# Patient Record
Sex: Male | Born: 1975 | Race: Black or African American | Hispanic: No | Marital: Married
Health system: Southern US, Community
[De-identification: ages and names within clinical notes are randomized; demographics above are authoritative.]

## PROBLEM LIST (undated history)

## (undated) DIAGNOSIS — Z87442 Personal history of urinary calculi: Secondary | ICD-10-CM

## (undated) HISTORY — PX: HIP SURGERY: SHX245

---

## 2011-07-26 ENCOUNTER — Emergency Department (HOSPITAL_COMMUNITY)
Admission: EM | Admit: 2011-07-26 | Discharge: 2011-07-26 | Disposition: A | Discharge status: Home / Self Care | Payer: BC Managed Care – PPO | Attending: Emergency Medicine | Admitting: Emergency Medicine

## 2011-07-26 ENCOUNTER — Encounter (HOSPITAL_COMMUNITY): Payer: Self-pay | Admitting: *Deleted

## 2011-07-26 DIAGNOSIS — K047 Periapical abscess without sinus: Secondary | ICD-10-CM | POA: Insufficient documentation

## 2011-07-26 MED ORDER — AMOXICILLIN 500 MG PO CAPS: 500.0000 mg | ORAL_CAPSULE | Freq: Three times a day (TID) | ORAL | Status: AC

## 2011-07-26 MED ORDER — NAPROXEN 500 MG PO TABS: 500.0000 mg | ORAL_TABLET | Freq: Two times a day (BID) | ORAL | Status: AC

## 2011-07-26 MED ORDER — AMOXICILLIN 500 MG PO CAPS
500.0000 mg | ORAL_CAPSULE | Freq: Once | ORAL | Status: AC
  Administered 2011-07-26: 500 mg via ORAL
  Filled 2011-07-26: qty 1

## 2011-07-26 MED ORDER — HYDROCODONE-ACETAMINOPHEN 5-500 MG PO TABS: 1.0000 | ORAL_TABLET | Freq: Four times a day (QID) | ORAL | Status: AC | PRN

## 2011-07-26 MED ORDER — KETOROLAC TROMETHAMINE 60 MG/2ML IM SOLN
60.0000 mg | Freq: Once | INTRAMUSCULAR | Status: AC
  Administered 2011-07-26: 60 mg via INTRAMUSCULAR
  Filled 2011-07-26: qty 2

## 2011-07-26 NOTE — ED Provider Notes (Signed)
History     CSN: [id number]  Arrival date & time 07/26/11  0100   First MD Initiated Contact with Patient 07/26/11 [phone]      Chief Complaint  Patient presents with  . Facial Swelling  . Abscess    inside left gum    (Consider location/radiation/quality/duration/timing/severity/associated sxs/prior treatment) HPI Comments: Left upper gum pain in the mouth, this started approximately 2 days ago, has been persistent, gradually worsening and associated with pain in the cheek. There is been no fevers nausea vomiting pain under the or difficulty swallowing. Symptoms are persistent, worse with palpation. The patient does not have a dentist  Patient is a 36 y.o. male presenting with abscess. The history is provided by the patient and the spouse.  Abscess  Pertinent negatives include no fever, no vomiting and no sore throat.    History reviewed. No pertinent past medical history.  Past Surgical History  Procedure Date  . Hip surgery     History reviewed. No pertinent family history.  History  Substance Use Topics  . Smoking status: Not on file  . Smokeless tobacco: Not on file  . Alcohol Use: No      Review of Systems  Constitutional: Negative for fever and chills.  HENT: Positive for facial swelling and dental problem. Negative for sore throat, trouble swallowing and voice change.        Toothache  Gastrointestinal: Negative for nausea and vomiting.    Allergies  Review of patient's allergies indicates no known allergies.  Home Medications   Current Outpatient Rx  Name Route Sig Dispense Refill  . AMOXICILLIN 500 MG PO CAPS Oral Take 1 capsule (500 mg total) by mouth 3 (three) times daily. 30 capsule 0  . HYDROCODONE-ACETAMINOPHEN 5-500 MG PO TABS Oral Take 1-2 tablets by mouth every 6 (six) hours as needed for pain. 15 tablet 0  . NAPROXEN 500 MG PO TABS Oral Take 1 tablet (500 mg total) by mouth 2 (two) times daily with a meal. 30 tablet 0    BP 137/77  Pulse 81   Temp 99.8 F (37.7 C) (Oral)  Resp 20  Ht 6\' 1"  (1.854 m)  Wt 226 lb (102.513 kg)  BMI 29.82 kg/m2  SpO2 100%  Physical Exam  Nursing note and vitals reviewed. Constitutional: He appears well-developed and well-nourished. No distress.  HENT:  Head: Normocephalic and atraumatic.  Mouth/Throat: Oropharynx is clear and moist. No oropharyngeal exudate.       Dental Disease is mild to moderate, tenderness over the left upper gum above the canine and premolar, no fluctuant mass  Eyes: Conjunctivae are normal. No scleral icterus.  Neck: Normal range of motion. Neck supple. No thyromegaly present.  Cardiovascular: Normal rate and regular rhythm.   Pulmonary/Chest: Effort normal and breath sounds normal.  Lymphadenopathy:    He has no cervical adenopathy.  Neurological: He is alert.  Skin: Skin is warm and dry. No rash noted. He is not diaphoretic.    ED Course  Procedures (including critical care time)  Labs Reviewed - No data to display No results found.   1. Dental abscess       MDM  Early dental abscess, no fever, no tachycardia, no tenderness under the tongue on palpation, no signs of Ludwig angina. Patient amenable to antibiotics and followup in the outpatient setting, dental followup information given.  Discharge Prescriptions include:  Naprosyn Hydrocodone Amox         Vida Roller, MD 07/26/11 [phone]

## 2011-07-26 NOTE — ED Notes (Signed)
Pt states he had an abscess on inside of left gum and that today it is swelling and causing pain in his left eye and closing in left nasal passage.

## 2013-09-04 ENCOUNTER — Emergency Department (HOSPITAL_BASED_OUTPATIENT_CLINIC_OR_DEPARTMENT_OTHER)
Admission: EM | Admit: 2013-09-04 | Discharge: 2013-09-05 | Disposition: A | Discharge status: Home / Self Care | Payer: BC Managed Care – PPO | Attending: Emergency Medicine | Admitting: Emergency Medicine

## 2013-09-04 ENCOUNTER — Encounter (HOSPITAL_BASED_OUTPATIENT_CLINIC_OR_DEPARTMENT_OTHER): Payer: Self-pay | Admitting: Emergency Medicine

## 2013-09-04 DIAGNOSIS — R109 Unspecified abdominal pain: Secondary | ICD-10-CM | POA: Diagnosis not present

## 2013-09-04 DIAGNOSIS — R51 Headache: Secondary | ICD-10-CM | POA: Insufficient documentation

## 2013-09-04 DIAGNOSIS — IMO0001 Reserved for inherently not codable concepts without codable children: Secondary | ICD-10-CM | POA: Insufficient documentation

## 2013-09-04 DIAGNOSIS — R197 Diarrhea, unspecified: Secondary | ICD-10-CM | POA: Diagnosis not present

## 2013-09-04 DIAGNOSIS — R112 Nausea with vomiting, unspecified: Secondary | ICD-10-CM | POA: Diagnosis not present

## 2013-09-04 DIAGNOSIS — R1110 Vomiting, unspecified: Secondary | ICD-10-CM

## 2013-09-04 LAB — URINE MICROSCOPIC-ADD ON

## 2013-09-04 LAB — URINALYSIS, ROUTINE W REFLEX MICROSCOPIC
Bilirubin Urine: NEGATIVE
Glucose, UA: NEGATIVE mg/dL
Ketones, ur: NEGATIVE mg/dL
LEUKOCYTES UA: NEGATIVE
NITRITE: NEGATIVE
PH: 6.5 (ref 5.0–8.0)
Protein, ur: NEGATIVE mg/dL
SPECIFIC GRAVITY, URINE: 1.014 (ref 1.005–1.030)
Urobilinogen, UA: 0.2 mg/dL (ref 0.0–1.0)

## 2013-09-04 MED ORDER — DICYCLOMINE HCL 10 MG/ML IM SOLN
20.0000 mg | Freq: Once | INTRAMUSCULAR | Status: AC
  Administered 2013-09-04: 20 mg via INTRAMUSCULAR
  Filled 2013-09-04: qty 2

## 2013-09-04 MED ORDER — SODIUM CHLORIDE 0.9 % IV BOLUS (SEPSIS)
1000.0000 mL | Freq: Once | INTRAVENOUS | Status: AC
  Administered 2013-09-04: 1000 mL via INTRAVENOUS

## 2013-09-04 MED ORDER — KETOROLAC TROMETHAMINE 30 MG/ML IJ SOLN
30.0000 mg | Freq: Once | INTRAMUSCULAR | Status: AC
  Administered 2013-09-04: 30 mg via INTRAVENOUS
  Filled 2013-09-04: qty 1

## 2013-09-04 MED ORDER — ONDANSETRON HCL 4 MG/2ML IJ SOLN
4.0000 mg | Freq: Once | INTRAMUSCULAR | Status: AC
  Administered 2013-09-04: 4 mg via INTRAVENOUS
  Filled 2013-09-04: qty 2

## 2013-09-04 NOTE — ED Provider Notes (Signed)
CSN: [id number]     Arrival date & time 09/04/13  2200 History   This chart was scribed for Matthew Bacchi Smitty Cords, MD by Matthew Lowery, ED Scribe. The patient was seen in room MH07/MH07. Patient's care was started at 11:28 PM.   Chief Complaint  Patient presents with  . Emesis   Patient is a 38 y.o. male presenting with vomiting. The history is provided by the patient. No language interpreter was used.  Emesis Severity:  Moderate Duration:  12 hours Number of daily episodes:  2 Able to tolerate:  Liquids Progression:  Improving Chronicity:  New Recent urination:  Normal Relieved by:  None tried Associated symptoms: abdominal pain (cramping), diarrhea, headaches and myalgias   Abdominal pain:    Location:  Generalized   Quality:  Cramping   Severity:  Moderate   Onset quality:  Sudden   Timing:  Constant   Progression:  Unchanged   Chronicity:  New Risk factors: no alcohol use    HPI Comments: Matthew Lowery is a 38 y.o. male who presents to the Emergency Department complaining of emesis onset approximately 12 hours ago. Pt reports associated nausea, diarrhea, abdominal cramping, HA, body aches. Pt reports 2 episodes of emesis and 8 episodes of diarrhea. He denies any urinary symptoms. Last consumed liquids at 2 PM. No sick contacts.  History reviewed. No pertinent past medical history. Past Surgical History  Procedure Laterality Date  . Hip surgery     History reviewed. No pertinent family history. History  Substance Use Topics  . Smoking status: Not on file  . Smokeless tobacco: Not on file  . Alcohol Use: No    Review of Systems  Constitutional: Negative for fever.  Gastrointestinal: Positive for nausea, vomiting, abdominal pain (cramping) and diarrhea.  Genitourinary: Negative.   Musculoskeletal: Positive for myalgias.  Neurological: Positive for headaches.  All other systems reviewed and are negative.  Allergies  Review of patient's allergies indicates no known  allergies.  Home Medications   Prior to Admission medications   Not on File   Triage Vitals: BP 129/74  Pulse 89  Temp(Src) 99.2 F (37.3 C)  Resp 16  Ht 6\' 1"  (1.854 m)  Wt 250 lb (113.399 kg)  BMI 32.99 kg/m2  SpO2 98% Physical Exam  Nursing note and vitals reviewed. Constitutional: He is oriented to person, place, and time. He appears well-developed and well-nourished. No distress.  HENT:  Head: Normocephalic and atraumatic.  Mouth/Throat: Oropharynx is clear and moist.  Eyes: Conjunctivae and EOM are normal. Pupils are equal, round, and reactive to light.  Neck: Normal range of motion. Neck supple.  Cardiovascular: Normal rate and regular rhythm.   Pulmonary/Chest: Effort normal and breath sounds normal. He has no wheezes. He has no rales.  Abdominal: Soft. He exhibits no distension. Bowel sounds are increased. There is no tenderness. There is no rebound and no guarding.  Hyperactive bowel sounds throughout   Musculoskeletal: Normal range of motion.  Neurological: He is alert and oriented to person, place, and time.  Skin: Skin is warm and dry.  Psychiatric: He has a normal mood and affect. His behavior is normal.   ED Course  Procedures (including critical care time) DIAGNOSTIC STUDIES: Oxygen Saturation is 98% on RA, normal by my interpretation.    COORDINATION OF CARE: 11:31 PM-Discussed treatment plan which includes UA with pt at bedside and pt agreed to plan.   Labs Review Labs Reviewed  URINALYSIS, ROUTINE W REFLEX MICROSCOPIC - Abnormal; Notable for  the following:    Hgb urine dipstick TRACE (*)    All other components within normal limits  COMPREHENSIVE METABOLIC PANEL - Abnormal; Notable for the following:    Potassium 3.5 (*)    Glucose, Bld 102 (*)    GFR calc non Af Amer 75 (*)    GFR calc Af Amer 87 (*)    All other components within normal limits  URINE MICROSCOPIC-ADD ON  LIPASE, BLOOD   Imaging Review No results found.   EKG  Interpretation None      MDM   Final diagnoses:  None    Patient is feeling markedly improved post pain medication and IVF.  This is classically viral n/v/d.  No indication for CT.  Have recommended lysol wiping all surfaces at home, bland diet and will give RX for zofran ODT.  Return for any returning symptoms   I personally performed the services described in this documentation, which was scribed in my presence. The recorded information has been reviewed and is accurate.    Matthew AweApril K Donyelle Enyeart-Rasch, MD 09/05/13 [phone]

## 2013-09-04 NOTE — ED Notes (Signed)
C/o generalized body pain, ha , abd pain x 12 hours,  Vomited x 2,  Diarrhea 8 times,  Feels like needles sticking in skin

## 2013-09-04 NOTE — ED Notes (Signed)
Pt c/o n/v/d and URI symptoms x 12 hrs

## 2013-09-05 ENCOUNTER — Encounter (HOSPITAL_BASED_OUTPATIENT_CLINIC_OR_DEPARTMENT_OTHER): Payer: Self-pay | Admitting: Emergency Medicine

## 2013-09-05 LAB — COMPREHENSIVE METABOLIC PANEL
ALBUMIN: 3.9 g/dL (ref 3.5–5.2)
ALT: 29 U/L (ref 0–53)
AST: 23 U/L (ref 0–37)
Alkaline Phosphatase: 80 U/L (ref 39–117)
Anion gap: 13 (ref 5–15)
BUN: 10 mg/dL (ref 6–23)
CALCIUM: 9 mg/dL (ref 8.4–10.5)
CO2: 24 mEq/L (ref 19–32)
CREATININE: 1.2 mg/dL (ref 0.50–1.35)
Chloride: 103 mEq/L (ref 96–112)
GFR calc Af Amer: 87 mL/min — ABNORMAL LOW (ref >90)
GFR, EST NON AFRICAN AMERICAN: 75 mL/min — AB (ref >90)
Glucose, Bld: 102 mg/dL — ABNORMAL HIGH (ref 70–99)
Potassium: 3.5 mEq/L — ABNORMAL LOW (ref 3.7–5.3)
Sodium: 140 mEq/L (ref 137–147)
TOTAL PROTEIN: 7.6 g/dL (ref 6.0–8.3)
Total Bilirubin: 0.7 mg/dL (ref 0.3–1.2)

## 2013-09-05 LAB — LIPASE, BLOOD: LIPASE: 17 U/L (ref 11–59)

## 2013-09-05 MED ORDER — ONDANSETRON 4 MG PO TBDP
ORAL_TABLET | ORAL | Status: AC
Start: 2013-09-05 — End: ?

## 2020-06-06 ENCOUNTER — Other Ambulatory Visit: Payer: Self-pay

## 2020-06-06 ENCOUNTER — Encounter (HOSPITAL_BASED_OUTPATIENT_CLINIC_OR_DEPARTMENT_OTHER): Payer: Self-pay | Admitting: *Deleted

## 2020-06-06 ENCOUNTER — Emergency Department (HOSPITAL_BASED_OUTPATIENT_CLINIC_OR_DEPARTMENT_OTHER): Payer: BC Managed Care – PPO

## 2020-06-06 ENCOUNTER — Emergency Department (HOSPITAL_BASED_OUTPATIENT_CLINIC_OR_DEPARTMENT_OTHER)
Admission: EM | Admit: 2020-06-06 | Discharge: 2020-06-06 | Disposition: A | Discharge status: Home / Self Care | Payer: BC Managed Care – PPO | Attending: Emergency Medicine | Admitting: Emergency Medicine

## 2020-06-06 DIAGNOSIS — N200 Calculus of kidney: Secondary | ICD-10-CM | POA: Insufficient documentation

## 2020-06-06 DIAGNOSIS — R109 Unspecified abdominal pain: Secondary | ICD-10-CM | POA: Diagnosis present

## 2020-06-06 LAB — BASIC METABOLIC PANEL
Anion gap: 5 (ref 5–15)
Anion gap: 10 (ref 5–15)
BUN: 6 mg/dL (ref 6–20)
BUN: 11 mg/dL (ref 6–20)
CO2: 13 mmol/L — ABNORMAL LOW (ref 22–32)
CO2: 22 mmol/L (ref 22–32)
Calcium: 4.5 mg/dL — CL (ref 8.9–10.3)
Calcium: 8.2 mg/dL — ABNORMAL LOW (ref 8.9–10.3)
Chloride: 127 mmol/L — ABNORMAL HIGH (ref 98–111)
Chloride: 110 mmol/L (ref 98–111)
Creatinine, Ser: 0.64 mg/dL (ref 0.61–1.24)
Creatinine, Ser: 1.26 mg/dL — ABNORMAL HIGH (ref 0.61–1.24)
GFR, Estimated: >60 mL/min (ref >60)
GFR, Estimated: >60 mL/min (ref >60)
Glucose, Bld: 95 mg/dL (ref 70–99)
Glucose, Bld: 131 mg/dL — ABNORMAL HIGH (ref 70–99)
Potassium: <2 mmol/L — CL (ref 3.5–5.1)
Potassium: 3.4 mmol/L — ABNORMAL LOW (ref 3.5–5.1)
Sodium: 145 mmol/L (ref 135–145)
Sodium: 142 mmol/L (ref 135–145)

## 2020-06-06 LAB — CBC
HCT: 33.4 % — ABNORMAL LOW (ref 39.0–52.0)
Hemoglobin: 10.4 g/dL — ABNORMAL LOW (ref 13.0–17.0)
MCH: 26.1 pg (ref 26.0–34.0)
MCHC: 31.1 g/dL (ref 30.0–36.0)
MCV: 83.7 fL (ref 80.0–100.0)
Platelets: 320 10*3/uL (ref 150–400)
RBC: 3.99 MIL/uL — ABNORMAL LOW (ref 4.22–5.81)
RDW: 15.3 % (ref 11.5–15.5)
WBC: 6.9 10*3/uL (ref 4.0–10.5)
nRBC: 0 % (ref 0.0–0.2)

## 2020-06-06 LAB — URINALYSIS, MICROSCOPIC (REFLEX)

## 2020-06-06 LAB — URINALYSIS, ROUTINE W REFLEX MICROSCOPIC
Bilirubin Urine: NEGATIVE
Glucose, UA: NEGATIVE mg/dL
Ketones, ur: NEGATIVE mg/dL
Leukocytes,Ua: NEGATIVE
Nitrite: NEGATIVE
Protein, ur: NEGATIVE mg/dL
Specific Gravity, Urine: 1.025 (ref 1.005–1.030)
pH: 6 (ref 5.0–8.0)

## 2020-06-06 MED ORDER — KETOROLAC TROMETHAMINE 15 MG/ML IJ SOLN
15.0000 mg | Freq: Once | INTRAMUSCULAR | Status: AC
  Administered 2020-06-06: 15 mg via INTRAVENOUS
  Filled 2020-06-06: qty 1

## 2020-06-06 MED ORDER — OXYCODONE HCL 5 MG PO TABS: 5.0000 mg | ORAL_TABLET | ORAL | 0 refills | Status: AC | PRN

## 2020-06-06 MED ORDER — ONDANSETRON HCL 4 MG/2ML IJ SOLN
4.0000 mg | Freq: Once | INTRAMUSCULAR | Status: AC
  Administered 2020-06-06: 4 mg via INTRAVENOUS
  Filled 2020-06-06: qty 2

## 2020-06-06 MED ORDER — SODIUM CHLORIDE 0.9 % IV BOLUS
1000.0000 mL | Freq: Once | INTRAVENOUS | Status: AC
  Administered 2020-06-06: 1000 mL via INTRAVENOUS

## 2020-06-06 MED ORDER — HYDROMORPHONE HCL 1 MG/ML IJ SOLN
1.0000 mg | Freq: Once | INTRAMUSCULAR | Status: AC
  Administered 2020-06-06: 1 mg via INTRAVENOUS
  Filled 2020-06-06: qty 1

## 2020-06-06 MED ORDER — OXYCODONE HCL 5 MG PO TABS: 5.0000 mg | ORAL_TABLET | ORAL | 0 refills | Status: DC | PRN

## 2020-06-06 MED ORDER — TAMSULOSIN HCL 0.4 MG PO CAPS: 0.4000 mg | ORAL_CAPSULE | Freq: Every day | ORAL | 0 refills | Status: DC

## 2020-06-06 MED ORDER — FENTANYL CITRATE (PF) 100 MCG/2ML IJ SOLN
50.0000 ug | INTRAMUSCULAR | Status: DC | PRN
  Administered 2020-06-06: 50 ug via INTRAVENOUS
  Filled 2020-06-06: qty 2

## 2020-06-06 NOTE — ED Triage Notes (Signed)
C/o right flank pain x 15 mins

## 2020-06-06 NOTE — ED Provider Notes (Signed)
[facility] Emergency Department Provider Note MRN:  [id number]  Arrival date & time: 06/06/20     Chief Complaint   Flank Pain   History of Present Illness   Matthew Lowery is a 45 y.o. year-old male with a history of kidney stones presenting to the ED with chief complaint of flank pain.  Location: Right plan Duration: 2 hours Onset: Sudden Timing: Constant Description: Matthew Lowery, feels similar to prior kidney stone Severity: Severe Exacerbating/Alleviating Factors: None Associated Symptoms: Nausea Pertinent Negatives: Denies hematuria, no dysuria, no fever, no chest pain   Review of Systems  A complete 10 system review of systems was obtained and all systems are negative except as noted in the HPI and PMH.   Patient's Health History   History reviewed. No pertinent past medical history.  Past Surgical History:  Procedure Laterality Date  . HIP SURGERY      No family history on file.  Social History   Socioeconomic History  . Marital status: Married    Spouse name: Not on file  . Number of children: Not on file  . Years of education: Not on file  . Highest education level: Not on file  Occupational History  . Not on file  Tobacco Use  . Smoking status: Not on file  . Smokeless tobacco: Not on file  Substance and Sexual Activity  . Alcohol use: No  . Drug use: No  . Sexual activity: Not on file  Other Topics Concern  . Not on file  Social History Narrative  . Not on file   Social Determinants of Health   Financial Resource Strain: Not on file  Food Insecurity: Not on file  Transportation Needs: Not on file  Physical Activity: Not on file  Stress: Not on file  Social Connections: Not on file  Intimate Partner Violence: Not on file     Physical Exam   Vitals:   06/06/20 0200 06/06/20 0326  BP: (!) 147/79 (!) 171/82  Pulse: 72 81  Resp: 16 16  Temp:    SpO2: 96% 99%    CONSTITUTIONAL: Well-appearing, in moderate  distress due to pain NEURO:  Alert and oriented x 3, no focal deficits EYES:  eyes equal and reactive ENT/NECK:  no LAD, no JVD CARDIO: Regular rate, well-perfused, normal S1 and S2 PULM:  CTAB no wheezing or rhonchi GI/GU:  normal bowel sounds, non-distended, non-tender MSK/SPINE:  No gross deformities, no edema SKIN:  no rash, atraumatic PSYCH:  Appropriate speech and behavior  *Additional and/or pertinent findings included in MDM below  Diagnostic and Interventional Summary    EKG Interpretation  Date/Time:    Ventricular Rate:    PR Interval:    QRS Duration:   QT Interval:    QTC Calculation:   R Axis:     Text Interpretation:        Labs Reviewed  URINALYSIS, ROUTINE W REFLEX MICROSCOPIC - Abnormal; Notable for the following components:      Result Value   Hgb urine dipstick MODERATE (*)    All other components within normal limits  CBC - Abnormal; Notable for the following components:   RBC 3.99 (*)    Hemoglobin 10.4 (*)    HCT 33.4 (*)    All other components within normal limits  BASIC METABOLIC PANEL - Abnormal; Notable for the following components:   Potassium <2.0 (*)    Chloride 127 (*)    CO2 13 (*)    Calcium 4.5 (*)  All other components within normal limits  BASIC METABOLIC PANEL - Abnormal; Notable for the following components:   Potassium 3.4 (*)    Glucose, Bld 131 (*)    Creatinine, Ser 1.26 (*)    Calcium 8.2 (*)    All other components within normal limits  URINALYSIS, MICROSCOPIC (REFLEX) - Abnormal; Notable for the following components:   Bacteria, UA RARE (*)    All other components within normal limits    CT Renal Stone Study  Final Result      Medications  fentaNYL (SUBLIMAZE) injection 50 mcg (50 mcg Intravenous Given 06/06/20 0124)  ondansetron (ZOFRAN) injection 4 mg (4 mg Intravenous Given 06/06/20 0123)  ketorolac (TORADOL) 15 MG/ML injection 15 mg (15 mg Intravenous Given 06/06/20 0323)  HYDROmorphone (DILAUDID) injection  1 mg (1 mg Intravenous Given 06/06/20 0324)  sodium chloride 0.9 % bolus 1,000 mL ( Intravenous Stopped 06/06/20 0423)     Procedures  /  Critical Care Procedures  ED Course and Medical Decision Making  I have reviewed the triage vital signs, the nursing notes, and pertinent available records from the EMR.  Listed above are laboratory and imaging tests that I personally ordered, reviewed, and interpreted and then considered in my medical decision making (see below for details).  Suspect kidney stone given history, will CT to evaluate.     Labs reveal very mild AKI.  Urinalysis without evidence of infection.  CT confirms kidney stone.  Patient's pain much better controlled, appropriate for discharge with urology follow-up.  [name]. [name], MD [facility] Emergency Medicine [facility] [email or web address]  Final Clinical Impressions(s) / ED Diagnoses     ICD-10-CM   1. Kidney stone  N20.0     ED Discharge Orders         Ordered    oxyCODONE (ROXICODONE) 5 MG immediate release tablet  Every 4 hours PRN        06/06/20 0559    tamsulosin (FLOMAX) 0.4 MG CAPS capsule  Daily        06/06/20 0559           Discharge Instructions Discussed with and Provided to Patient:     Discharge Instructions     You were evaluated in the Emergency Department and after careful evaluation, we did not find any emergent condition requiring admission or further testing in the hospital.  Your exam/testing today is overall reassuring.  Symptoms seem to be due to a kidney stone.  Please drink plenty of fluids at home.  Use Tylenol or Motrin for pain.  For more significant pain you can use the oxycodone medicine provided.  Use the Flomax medication as prescribed to help you pass the stone.  Recommend follow-up with a urologist.  Please return to the Emergency Department if you experience any worsening of your condition.   Thank you for allowing [place] to be a part of your care.        Sabas Sous, MD 06/06/20 [phone]

## 2020-06-06 NOTE — ED Notes (Signed)
ED Provider at bedside. 

## 2020-06-06 NOTE — ED Notes (Signed)
Left message with [facility] to cancel prescription.

## 2020-06-06 NOTE — Discharge Instructions (Signed)
You were evaluated in the Emergency Department and after careful evaluation, we did not find any emergent condition requiring admission or further testing in the hospital.  Your exam/testing today is overall reassuring.  Symptoms seem to be due to a kidney stone.  Please drink plenty of fluids at home.  Use Tylenol or Motrin for pain.  For more significant pain you can use the oxycodone medicine provided.  Use the Flomax medication as prescribed to help you pass the stone.  Recommend follow-up with a urologist.  Please return to the Emergency Department if you experience any worsening of your condition.   Thank you for allowing [place] to be a part of your care.

## 2020-06-10 ENCOUNTER — Other Ambulatory Visit: Payer: Self-pay

## 2020-06-10 ENCOUNTER — Encounter (HOSPITAL_BASED_OUTPATIENT_CLINIC_OR_DEPARTMENT_OTHER): Payer: Self-pay

## 2020-06-10 ENCOUNTER — Emergency Department (HOSPITAL_BASED_OUTPATIENT_CLINIC_OR_DEPARTMENT_OTHER)
Admission: EM | Admit: 2020-06-10 | Discharge: 2020-06-10 | Disposition: A | Discharge status: Home / Self Care | Payer: BC Managed Care – PPO | Attending: Emergency Medicine | Admitting: Emergency Medicine

## 2020-06-10 ENCOUNTER — Emergency Department (HOSPITAL_BASED_OUTPATIENT_CLINIC_OR_DEPARTMENT_OTHER): Payer: BC Managed Care – PPO

## 2020-06-10 DIAGNOSIS — E876 Hypokalemia: Secondary | ICD-10-CM | POA: Diagnosis not present

## 2020-06-10 DIAGNOSIS — N201 Calculus of ureter: Secondary | ICD-10-CM | POA: Insufficient documentation

## 2020-06-10 DIAGNOSIS — N200 Calculus of kidney: Secondary | ICD-10-CM

## 2020-06-10 DIAGNOSIS — R109 Unspecified abdominal pain: Secondary | ICD-10-CM | POA: Diagnosis present

## 2020-06-10 LAB — CBC WITH DIFFERENTIAL/PLATELET
Abs Immature Granulocytes: 0.03 10*3/uL (ref 0.00–0.07)
Basophils Absolute: 0 10*3/uL (ref 0.0–0.1)
Basophils Relative: 1 %
Eosinophils Absolute: 0.2 10*3/uL (ref 0.0–0.5)
Eosinophils Relative: 3 %
HCT: 32.2 % — ABNORMAL LOW (ref 39.0–52.0)
Hemoglobin: 10.1 g/dL — ABNORMAL LOW (ref 13.0–17.0)
Immature Granulocytes: 0 %
Lymphocytes Relative: 21 %
Lymphs Abs: 1.6 10*3/uL (ref 0.7–4.0)
MCH: 26.1 pg (ref 26.0–34.0)
MCHC: 31.4 g/dL (ref 30.0–36.0)
MCV: 83.2 fL (ref 80.0–100.0)
Monocytes Absolute: 0.7 10*3/uL (ref 0.1–1.0)
Monocytes Relative: 8 %
Neutro Abs: 5.2 10*3/uL (ref 1.7–7.7)
Neutrophils Relative %: 67 %
Platelets: 370 10*3/uL (ref 150–400)
RBC: 3.87 MIL/uL — ABNORMAL LOW (ref 4.22–5.81)
RDW: 15.1 % (ref 11.5–15.5)
WBC: 7.8 10*3/uL (ref 4.0–10.5)
nRBC: 0 % (ref 0.0–0.2)

## 2020-06-10 LAB — COMPREHENSIVE METABOLIC PANEL
ALT: 38 U/L (ref 0–44)
AST: 29 U/L (ref 15–41)
Albumin: 3.7 g/dL (ref 3.5–5.0)
Alkaline Phosphatase: 88 U/L (ref 38–126)
Anion gap: 10 (ref 5–15)
BUN: 12 mg/dL (ref 6–20)
CO2: 26 mmol/L (ref 22–32)
Calcium: 8.2 mg/dL — ABNORMAL LOW (ref 8.9–10.3)
Chloride: 102 mmol/L (ref 98–111)
Creatinine, Ser: 1.54 mg/dL — ABNORMAL HIGH (ref 0.61–1.24)
GFR, Estimated: 56 mL/min — ABNORMAL LOW (ref >60)
Glucose, Bld: 97 mg/dL (ref 70–99)
Potassium: 3.1 mmol/L — ABNORMAL LOW (ref 3.5–5.1)
Sodium: 138 mmol/L (ref 135–145)
Total Bilirubin: 0.5 mg/dL (ref 0.3–1.2)
Total Protein: 7.5 g/dL (ref 6.5–8.1)

## 2020-06-10 LAB — URINALYSIS, ROUTINE W REFLEX MICROSCOPIC
Bilirubin Urine: NEGATIVE
Glucose, UA: NEGATIVE mg/dL
Ketones, ur: NEGATIVE mg/dL
Leukocytes,Ua: NEGATIVE
Nitrite: NEGATIVE
Protein, ur: NEGATIVE mg/dL
Specific Gravity, Urine: 1.01 (ref 1.005–1.030)
pH: 6.5 (ref 5.0–8.0)

## 2020-06-10 LAB — URINALYSIS, MICROSCOPIC (REFLEX): WBC, UA: NONE SEEN WBC/hpf (ref 0–5)

## 2020-06-10 MED ORDER — OXYCODONE-ACETAMINOPHEN 5-325 MG PO TABS: 1.0000 | ORAL_TABLET | Freq: Four times a day (QID) | ORAL | 0 refills | Status: AC | PRN

## 2020-06-10 MED ORDER — TAMSULOSIN HCL 0.4 MG PO CAPS: 0.4000 mg | ORAL_CAPSULE | Freq: Every day | ORAL | 0 refills | Status: AC

## 2020-06-10 MED ORDER — KETOROLAC TROMETHAMINE 30 MG/ML IJ SOLN
30.0000 mg | Freq: Once | INTRAMUSCULAR | Status: AC
  Administered 2020-06-10: 30 mg via INTRAVENOUS
  Filled 2020-06-10: qty 1

## 2020-06-10 MED ORDER — POTASSIUM CHLORIDE CRYS ER 20 MEQ PO TBCR: 40.0000 meq | EXTENDED_RELEASE_TABLET | Freq: Every day | ORAL | 0 refills | Status: AC

## 2020-06-10 MED ORDER — SENNOSIDES-DOCUSATE SODIUM 8.6-50 MG PO TABS: 1.0000 | ORAL_TABLET | Freq: Every evening | ORAL | 0 refills | Status: AC | PRN

## 2020-06-10 NOTE — ED Triage Notes (Signed)
Pt presents with worsening R flank pain and n/v. Was seen on 5/19 and dx with kidney stone. States has finished his rx and cannot get an appt with urology.

## 2020-06-10 NOTE — Discharge Instructions (Signed)
You were seen in the emerge department today with continued kidney stone pain.  I have placed a referral in our system for you to see the urologist sooner.  I have refilled your pain medications and will have you continue the Flomax.  If you develop fever or chills please return to the emergency department, ideally [facility], where the urology team operates.  If you feel he cannot make it there he could call 911 or return here and we be happy to see you.

## 2020-06-10 NOTE — ED Provider Notes (Signed)
Emergency Department Provider Note   I have reviewed the triage vital signs and the nursing notes.   HISTORY  Chief Complaint Flank Pain   HPI Matthew Lowery is a 45 y.o. male with past history reviewed below returns to the emergency department with continued right flank pain.  He was diagnosed with a 4 mm ureteral stone with mild hydro on 5/19.  His pain was controlled and was discharged home with plan for expectant management and urology follow-up.  He states he has had continued pain with occasional vomiting.  He has had some chills but no fever.  Pain is worse especially in the evenings.  He is called multiple urology practices but no one can see him until July.  He is out of medications and so return to the emergency department for reevaluation.   History reviewed. No pertinent past medical history.  There are no problems to display for this patient.   Past Surgical History:  Procedure Laterality Date  . HIP SURGERY      Allergies Patient has no known allergies.  History reviewed. No pertinent family history.  Social History Social History   Substance Use Topics  . Alcohol use: No  . Drug use: No    Review of Systems  Constitutional: No fever/chills Eyes: No visual changes. ENT: No sore throat. Cardiovascular: Denies chest pain. Respiratory: Denies shortness of breath. Gastrointestinal: Positive right flank/abdominal pain.  Intermittent nausea/vomiting.  No diarrhea.  No constipation. Genitourinary: Negative for dysuria. Musculoskeletal: Positive for back pain. Skin: Negative for rash. Neurological: Negative for headaches, focal weakness or numbness.  10-point ROS otherwise negative.  ____________________________________________   PHYSICAL EXAM:  VITAL SIGNS: ED Triage Vitals  Enc Vitals Group     BP 06/10/20 1856 (!) 154/85     Pulse Rate 06/10/20 1856 78     Resp 06/10/20 1856 16     Temp 06/10/20 1856 99 F (37.2 C)     Temp Source 06/10/20  1856 Oral     SpO2 06/10/20 1856 99 %     Weight 06/10/20 1857 (!) 310 lb (140.6 kg)     Height 06/10/20 1857 6\' 1"  (1.854 m)   Constitutional: Alert and oriented. Well appearing and in no acute distress. Eyes: Conjunctivae are normal.  Head: Atraumatic. Nose: No congestion/rhinnorhea. Mouth/Throat: Mucous membranes are moist.  Neck: No stridor.  Cardiovascular: Normal rate, regular rhythm. Good peripheral circulation. Grossly normal heart sounds.   Respiratory: Normal respiratory effort.  No retractions. Lungs CTAB. Gastrointestinal: Soft and nontender. No distention.  Musculoskeletal: No gross deformities of extremities. Neurologic:  Normal speech and language.  Skin:  Skin is warm, dry and intact. No rash noted.   ____________________________________________   LABS (all labs ordered are listed, but only abnormal results are displayed)  Labs Reviewed  URINALYSIS, ROUTINE W REFLEX MICROSCOPIC - Abnormal; Notable for the following components:      Result Value   Hgb urine dipstick TRACE (*)    All other components within normal limits  COMPREHENSIVE METABOLIC PANEL - Abnormal; Notable for the following components:   Potassium 3.1 (*)    Creatinine, Ser 1.54 (*)    Calcium 8.2 (*)    GFR, Estimated 56 (*)    All other components within normal limits  CBC WITH DIFFERENTIAL/PLATELET - Abnormal; Notable for the following components:   RBC 3.87 (*)    Hemoglobin 10.1 (*)    HCT 32.2 (*)    All other components within normal limits  URINALYSIS,  MICROSCOPIC (REFLEX) - Abnormal; Notable for the following components:   Bacteria, UA RARE (*)    All other components within normal limits  URINE CULTURE   ____________________________________________  RADIOLOGY  DG Abdomen 1 View  Result Date: 06/10/2020 CLINICAL DATA:  Kidney stone right. Worsening right flank pain. Nausea and vomiting. EXAM: ABDOMEN - 1 VIEW COMPARISON:  CT 06/06/2020 FINDINGS: 4 mm stone in the right pelvis  corresponds to distal ureteric stone on recent CT. This is not significantly changed in position. No additional urolithiasis. Normal bowel gas pattern without obstruction. Lung bases are clear. IMPRESSION: A 4 mm stone in the right pelvis corresponds to distal ureteric stone on recent CT, unchanged in position over the past 4 days. Electronically Signed   By: Narda Rutherford M.D.   On: 06/10/2020 20:59    ____________________________________________   PROCEDURES  Procedure(s) performed:   Procedures  None  ____________________________________________   INITIAL IMPRESSION / ASSESSMENT AND PLAN / ED COURSE  Pertinent labs & imaging results that were available during my care of the patient were reviewed by me and considered in my medical decision making (see chart for details).   Patient returns to the emergency department with continued right flank pain in the setting of known ureteral stone.  I reviewed the CT scan from the 19th as well as lab work and urine analysis.  Patient is afebrile here.  He appears uncomfortable but in no distress.  Plan for pain management and repeat labs, UA with culture, KUB to assess for any stone migration.   09:30 PM  Patient's KUB shows unchanged position of the 4 mm ureteral stone.  His pain is well controlled here, however.  He is feeling better.  We will refill his pain medications.  I have placed a referral in our system to facilitate getting him into see urology sooner.  He does not show sign of infection in his UA.  [place] drug database reviewed prior to [facility].  ____________________________________________  FINAL CLINICAL IMPRESSION(S) / ED DIAGNOSES  Final diagnoses:  Kidney stone  Hypokalemia     MEDICATIONS GIVEN DURING THIS VISIT:  Medications  ketorolac (TORADOL) 30 MG/ML injection 30 mg (30 mg Intravenous Given 06/10/20 1956)     NEW OUTPATIENT MEDICATIONS STARTED DURING THIS VISIT:  New Prescriptions    OXYCODONE-ACETAMINOPHEN (PERCOCET/ROXICET) 5-325 MG TABLET    Take 1 tablet by mouth every 6 (six) hours as needed for severe pain.   POTASSIUM CHLORIDE SA (KLOR-CON) 20 MEQ TABLET    Take 2 tablets (40 mEq total) by mouth daily for 4 days.   SENNA-DOCUSATE (SENOKOT-S) 8.6-50 MG TABLET    Take 1 tablet by mouth at bedtime as needed for mild constipation or moderate constipation.   TAMSULOSIN (FLOMAX) 0.4 MG CAPS CAPSULE    Take 1 capsule (0.4 mg total) by mouth daily for 14 days.    Note:  This document was prepared using Dragon voice recognition software and may include unintentional dictation errors.  Alona Bene, MD, [facility] Emergency Medicine    Westyn Keatley, Arlyss Repress, MD 06/10/20 2130

## 2020-06-11 ENCOUNTER — Encounter (HOSPITAL_COMMUNITY): Payer: Self-pay | Admitting: Urology

## 2020-06-11 ENCOUNTER — Other Ambulatory Visit: Payer: Self-pay | Admitting: Urology

## 2020-06-11 NOTE — Patient Instructions (Addendum)
DUE TO COVID-19 ONLY ONE VISITOR IS ALLOWED TO COME WITH YOU AND STAY IN THE WAITING ROOM ONLY DURING PRE OP AND PROCEDURE.   **NO VISITORS ARE ALLOWED IN THE SHORT STAY AREA OR RECOVERY ROOM!!**       Your procedure is scheduled on: Thursday, Jun 13, 2020   Report to [facility] Main  Entrance    Report to admitting at 1:30 PM   Call this number if you have problems the morning of surgery [phone]   Do not eat food :After Midnight.   May have liquids until  12:30PM  day of surgery  CLEAR LIQUID DIET  Foods Allowed                                                                     Foods Excluded  Water, Black Coffee and tea, regular and decaf                             liquids that you cannot  Plain Jell-O in any flavor  (No red)                                           see through such as: Fruit ices (not with fruit pulp)                                     milk, soups, orange juice              Iced Popsicles (No red)                                    All solid food                                   Apple juices Sports drinks like Gatorade (No red) Lightly seasoned clear broth or consume(fat free) Sugar, honey syrup  Sample Menu Breakfast                                Lunch                                     Supper Cranberry juice                    Beef broth                            Chicken broth Jell-O                                     Grape juice  Apple juice Coffee or tea                        Jell-O                                      Popsicle                                                Coffee or tea                        Coffee or tea      Oral Hygiene is also important to reduce your risk of infection.                                    Remember - BRUSH YOUR TEETH THE MORNING OF SURGERY WITH YOUR REGULAR TOOTHPASTE   Do NOT smoke after Midnight   Take these medicines the morning of surgery with A SIP OF WATER:  Tamsulosin, Zofran if needed, Oxycodone if needed and   if you have taken it before on an empty stomach without experiencing nausea and vomiting                                You may not have any metal on your body including hair pins, jewelry, and body piercings             Do not wear make-up, lotions, powders, perfumes/cologne, or deodorant                          Men may shave face and neck.   Do not bring valuables to the hospital. [facility] IS NOT             RESPONSIBLE   FOR VALUABLES.   Contacts, dentures or bridgework may not be worn into surgery.    Patients discharged the day of surgery will not be allowed to drive home.   Special Instructions: Bring a copy of your healthcare power of attorney and living will documents         the day of surgery if you haven't scanned them in before.              Please read over the following fact sheets you were given: IF YOU HAVE QUESTIONS ABOUT YOUR PRE OP INSTRUCTIONS PLEASE CALL [phone]

## 2020-06-12 ENCOUNTER — Other Ambulatory Visit: Payer: Self-pay | Admitting: Urology

## 2020-06-12 ENCOUNTER — Encounter (HOSPITAL_COMMUNITY)
Admission: RE | Admit: 2020-06-12 | Discharge: 2020-06-12 | Disposition: A | Discharge status: Home / Self Care | Payer: BC Managed Care – PPO | Source: Ambulatory Visit | Attending: Urology | Admitting: Urology

## 2020-06-12 DIAGNOSIS — N201 Calculus of ureter: Secondary | ICD-10-CM

## 2020-06-12 LAB — URINE CULTURE: Culture: <10000 — AB

## 2020-06-12 NOTE — Progress Notes (Addendum)
I called Pt for his PAT phone call and  he wishes to cancel the surgery on 06/13/20 and have a lithotripsy instead and that he can wait for it.  I told him to call the office and gave him the number. I called Coni Mabe to let her know.

## 2020-06-13 ENCOUNTER — Ambulatory Visit (HOSPITAL_COMMUNITY): Admission: RE | Admit: 2020-06-13 | Payer: BC Managed Care – PPO | Source: Ambulatory Visit | Admitting: Urology

## 2020-06-13 ENCOUNTER — Encounter (HOSPITAL_COMMUNITY): Admission: RE | Payer: Self-pay | Source: Ambulatory Visit

## 2020-06-13 HISTORY — DX: Personal history of urinary calculi: Z87.442

## 2020-06-13 SURGERY — CYSTOSCOPY/URETEROSCOPY/HOLMIUM LASER/STENT PLACEMENT
Anesthesia: General | Laterality: Right

## 2020-06-24 ENCOUNTER — Encounter (HOSPITAL_BASED_OUTPATIENT_CLINIC_OR_DEPARTMENT_OTHER): Payer: Self-pay

## 2020-06-24 ENCOUNTER — Ambulatory Visit (HOSPITAL_BASED_OUTPATIENT_CLINIC_OR_DEPARTMENT_OTHER): Admit: 2020-06-24 | Payer: BC Managed Care – PPO | Admitting: Urology

## 2020-06-24 SURGERY — LITHOTRIPSY, ESWL
Anesthesia: LOCAL | Laterality: Right

## 2022-03-15 IMAGING — DX DG ABDOMEN 1V
2 series · 2 of 2 positions shown · non-contrast
Comparison: CT 06/06/2020

CLINICAL DATA: Kidney stone right. Worsening right flank pain.
Nausea and vomiting.

EXAM:
ABDOMEN - 1 VIEW

[abdomen kub (1 of 2)]
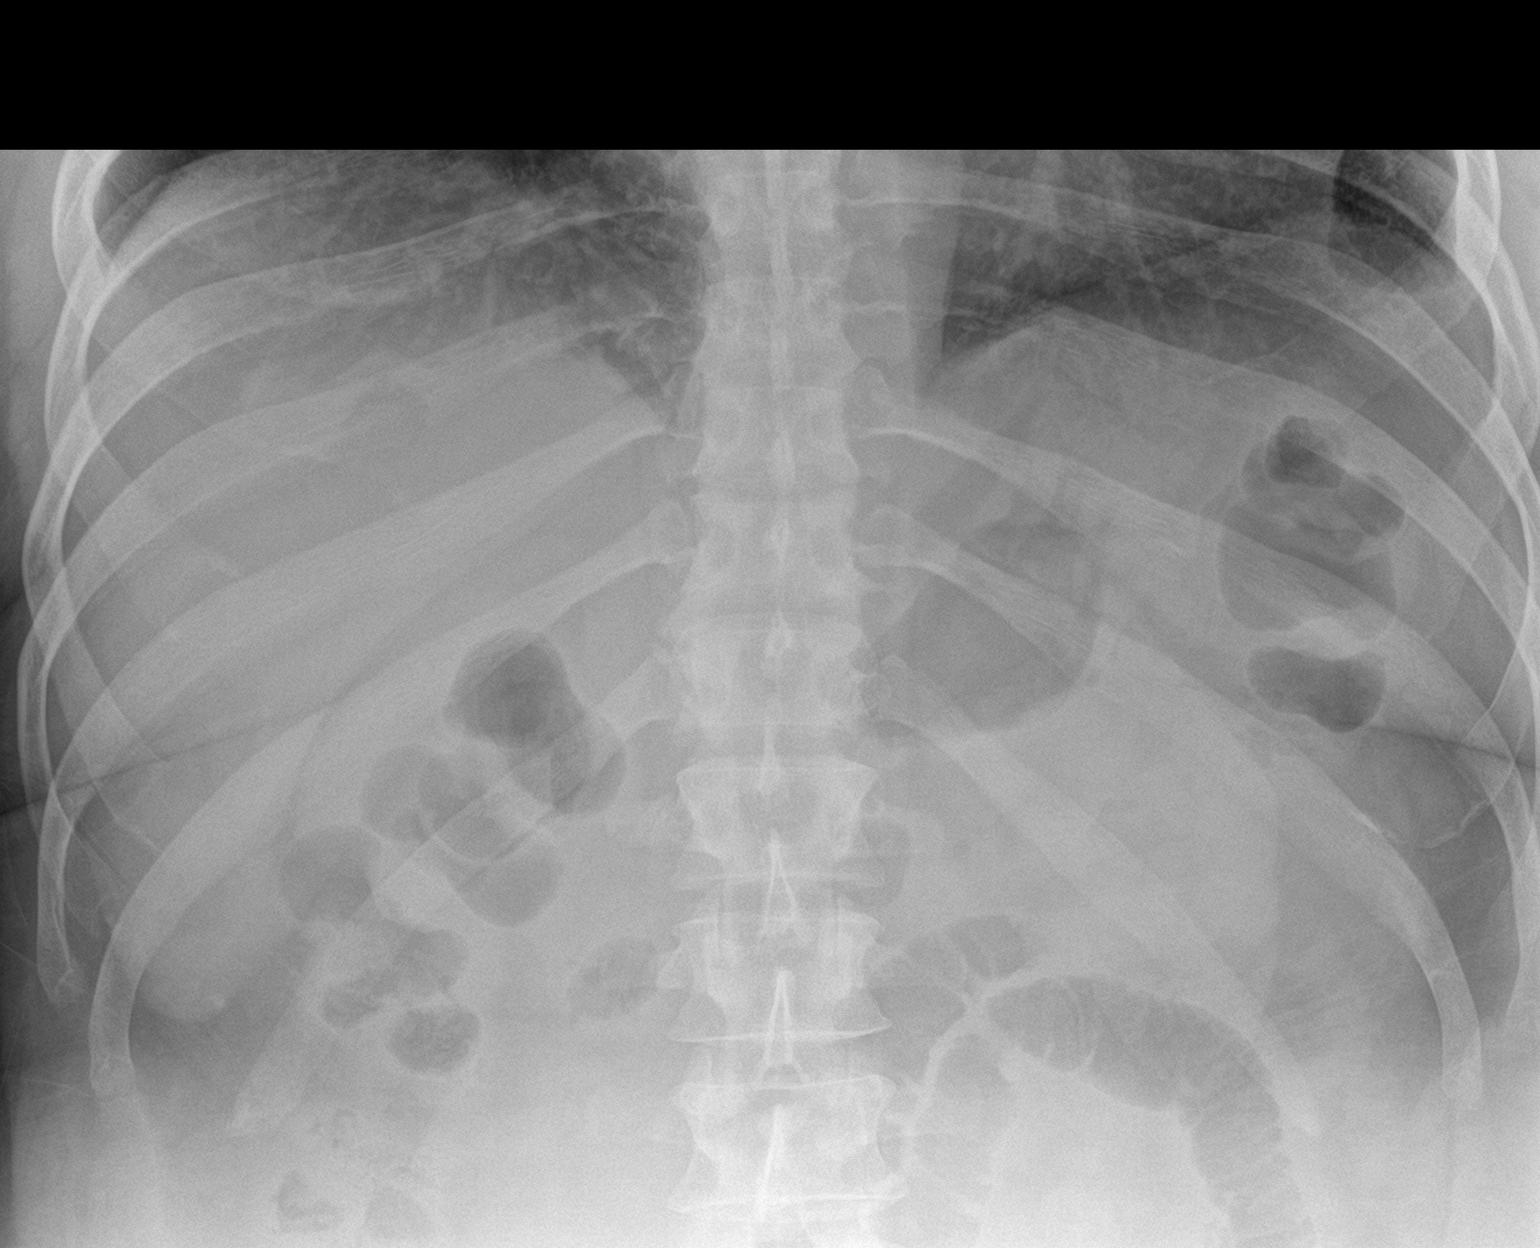

[abdomen kub (2 of 2)]
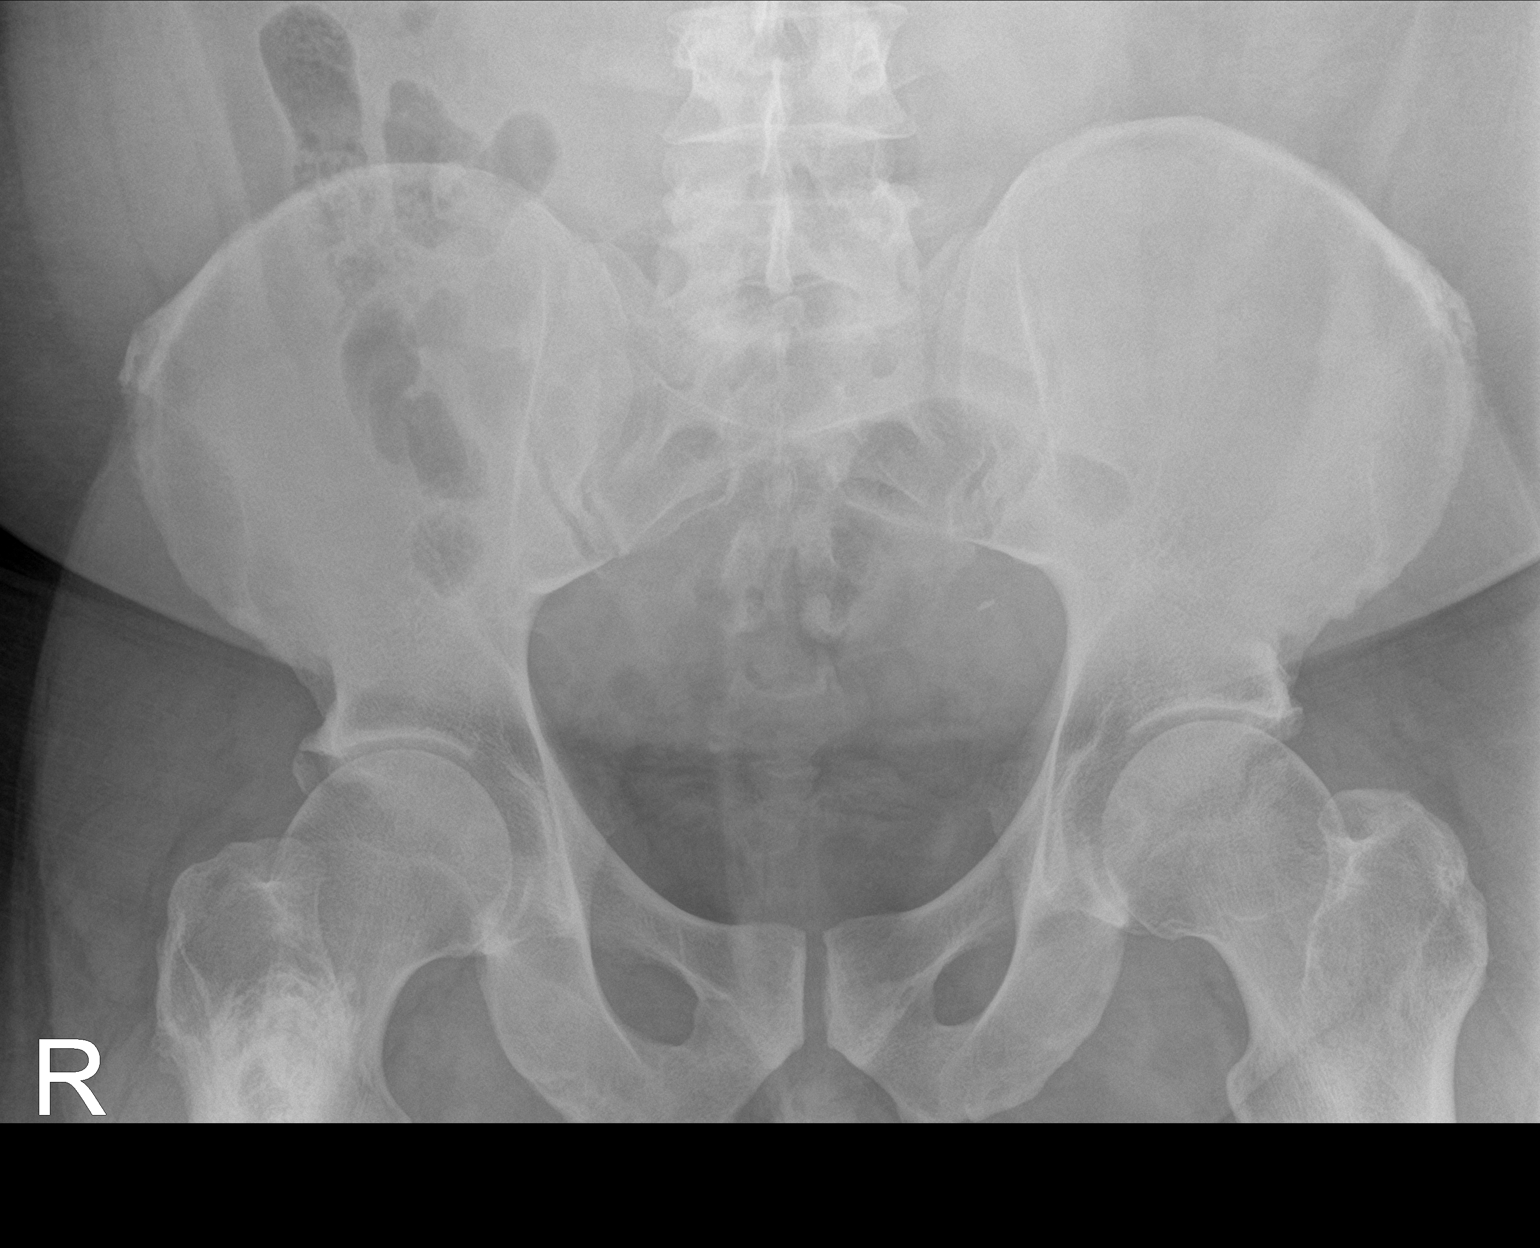

[2 of 2 positions shown; findings below may reference images not displayed]

FINDINGS: 4 mm stone in the right pelvis corresponds to distal ureteric stone
on recent CT. This is not significantly changed in position. No
additional urolithiasis. Normal bowel gas pattern without
obstruction. Lung bases are clear.
IMPRESSION: A 4 mm stone in the right pelvis corresponds to distal ureteric
stone on recent CT, unchanged in position over the past 4 days.
